# Patient Record
Sex: Female | Born: 1965 | Race: White | Hispanic: No | Marital: Married | Smoking: Never smoker
Health system: Southern US, Community
[De-identification: ages and names within clinical notes are randomized; demographics above are authoritative.]

## PROBLEM LIST (undated history)

## (undated) DIAGNOSIS — F419 Anxiety disorder, unspecified: Secondary | ICD-10-CM

## (undated) DIAGNOSIS — R51 Headache: Secondary | ICD-10-CM

## (undated) HISTORY — PX: WISDOM TOOTH EXTRACTION: SHX21

---

## 2003-11-01 HISTORY — PX: COLONOSCOPY: SHX174

## 2012-08-13 ENCOUNTER — Ambulatory Visit (INDEPENDENT_AMBULATORY_CARE_PROVIDER_SITE_OTHER): Payer: BC Managed Care – PPO | Admitting: Family Medicine

## 2012-08-13 ENCOUNTER — Encounter: Payer: Self-pay | Admitting: Family Medicine

## 2012-08-13 VITALS — BP 134/81 | HR 61 | Ht 67.0 in | Wt 145.0 lb

## 2012-08-13 DIAGNOSIS — M79671 Pain in right foot: Secondary | ICD-10-CM

## 2012-08-13 DIAGNOSIS — M2140 Flat foot [pes planus] (acquired), unspecified foot: Secondary | ICD-10-CM

## 2012-08-13 DIAGNOSIS — M79609 Pain in unspecified limb: Secondary | ICD-10-CM

## 2012-08-14 ENCOUNTER — Encounter: Payer: Self-pay | Admitting: Family Medicine

## 2012-08-14 DIAGNOSIS — M2140 Flat foot [pes planus] (acquired), unspecified foot: Secondary | ICD-10-CM | POA: Insufficient documentation

## 2012-08-14 DIAGNOSIS — M79672 Pain in left foot: Secondary | ICD-10-CM | POA: Insufficient documentation

## 2012-08-14 NOTE — Assessment & Plan Note (Signed)
no tenderness on exam dorsally, plantar areas.  Does sound more dynamic and related to pes planus.  This should respond to post tib rehab and inserts but she's not had success finding inserts that are comfortable to her - ones she brought with her have a very high arch and cause more arch pain.  Advised to try dr. Jari Sportsman active series over the counter.  Can consider our custom ones or sports insoles if she does not tolerate these.  Shown calf raise, theraband internal rotation exercises.  F/u in 1 month or as needed.

## 2012-08-14 NOTE — Progress Notes (Signed)
  Subjective:    Patient ID: Patty Hampton, female    DOB: [birth year], 46 y.o.   MRN: [id number]  PCP: Dr. Mikey Bussing  HPI 47 yo F here for bilateral foot pain.  Patient reports for several years she has had pain in L > R feet worse with sports (golf, tennis) and prolonged standing. Able to sit at work. No known injuries to feet. Pain does go up medial left leg at times. No numbness, tingling. Seen a PT who made her a couple different pairs of orthotics but she did not tolerate these. Pain is usually within arch but can be elsewhere on feet.  History reviewed. No pertinent past medical history.  Current Outpatient Prescriptions on File Prior to Visit  Medication Sig Dispense Refill  . NECON 1/50, 28, 1-50 MG-MCG tablet         History reviewed. No pertinent past surgical history.  No Known Allergies  History   Social History  . Marital Status: Married    Spouse Name: N/A    Number of Children: N/A  . Years of Education: N/A   Occupational History  . Not on file.   Social History Main Topics  . Smoking status: Never Smoker   . Smokeless tobacco: Not on file  . Alcohol Use: Not on file  . Drug Use: Not on file  . Sexually Active: Not on file   Other Topics Concern  . Not on file   Social History Narrative  . No narrative on file    Family History  Problem Relation Age of Onset  . Sudden death Neg Hx   . Hypertension Neg Hx   . Hyperlipidemia Neg Hx   . Heart attack Neg Hx   . Diabetes Neg Hx     BP 134/81  Pulse 61  Ht 5\' 7"  (1.702 m)  Wt 145 lb (65.772 kg)  BMI 22.71 kg/m2  Review of Systems See HPI above.    Objective:   Physical Exam Gen: NAD  Bilateral feet/ankles: No gross deformity, swelling, ecchymoses Mod overpronation slightly worse on left. FROM with 5/5 strength all directions grossly. No TTP throughout bilateral feet/ankles currently. Negative ant drawer and talar tilt.   Negative syndesmotic compression. Thompsons test  negative. NV intact distally.    Assessment & Plan:  1. Bilateral foot pain - no tenderness on exam dorsally, plantar areas.  Does sound more dynamic and related to pes planus.  This should respond to post tib rehab and inserts but she's not had success finding inserts that are comfortable to her - ones she brought with her have a very high arch and cause more arch pain.  Advised to try dr. Jari Sportsman active series over the counter.  Can consider our custom ones or sports insoles if she does not tolerate these.  Shown calf raise, theraband internal rotation exercises.  F/u in 1 month or as needed.

## 2012-12-04 ENCOUNTER — Encounter (HOSPITAL_COMMUNITY): Payer: Self-pay | Admitting: Pharmacist

## 2012-12-17 ENCOUNTER — Encounter (HOSPITAL_COMMUNITY)
Admission: RE | Admit: 2012-12-17 | Discharge: 2012-12-17 | Disposition: A | Discharge status: Home / Self Care | Payer: BC Managed Care – PPO | Source: Ambulatory Visit | Attending: Obstetrics and Gynecology | Admitting: Obstetrics and Gynecology

## 2012-12-17 ENCOUNTER — Encounter (HOSPITAL_COMMUNITY): Payer: Self-pay

## 2012-12-17 HISTORY — DX: Headache: R51

## 2012-12-17 HISTORY — DX: Anxiety disorder, unspecified: F41.9

## 2012-12-17 LAB — SURGICAL PCR SCREEN
MRSA, PCR: NEGATIVE
Staphylococcus aureus: NEGATIVE

## 2012-12-17 LAB — CBC
HCT: 43.2 % (ref 36.0–46.0)
Hemoglobin: 15.2 g/dL — ABNORMAL HIGH (ref 12.0–15.0)
RBC: 4.8 MIL/uL (ref 3.87–5.11)

## 2012-12-17 NOTE — H&P (Signed)
Patty Hampton is an 47 y.o. G 0 female presents for LAVH.  Had been of birth control pills for heavy cycles.  Despite this her cycles are heavy.  She had 3 to 4 days of heavy flow changing pads every hour.  Hemoglobin had been borderline at 11.8.  Saline infusion sonogram revealed multiple samll fibroids and probable adenomyosis.   Also having dyspareunia. Options discussed including mirena iud, depo provera, ablation or hysterectomy.  Admitted now for LAVH.    Pertinent Gynecological History: Menses: flow is excessive with use of 14 pads or tampons on heaviest days Bleeding: menorrhagia Contraception: OCP (estrogen/progesterone) DES exposure: denies Blood transfusions: none Sexually transmitted diseases: no past history Previous GYN Procedures: none  Last mammogram: normal Date: 10/2012 Last pap: normal Date: 102014 OB History: G0, P0   Menstrual History: Menarche age: 5  No LMP recorded.    No past medical history on file.  No past surgical history on file.  Family History  Problem Relation Age of Onset  . Sudden death Neg Hx   . Hypertension Neg Hx   . Hyperlipidemia Neg Hx   . Heart attack Neg Hx   . Diabetes Neg Hx     Social History:  reports that she has never smoked. She does not have any smokeless tobacco history on file. Her alcohol and drug histories are not on file.  Allergies:  Allergies  Allergen Reactions  . Tape Dermatitis    No prescriptions prior to admission    Review of Systems  All other systems reviewed and are negative.    There were no vitals taken for this visit. Physical Exam  Constitutional: She is oriented to person, place, and time. She appears well-developed and well-nourished.  HENT:  Head: Normocephalic.  Mouth/Throat: Oropharynx is clear and moist.  Eyes: Conjunctivae are normal. Pupils are equal, round, and reactive to light.  Cardiovascular: Normal rate, regular rhythm and normal heart sounds.   Respiratory: Effort normal and  breath sounds normal.  GI: Soft. Bowel sounds are normal.  Genitourinary:  Uterus upper limits of normal in size and tender. Adnexa clear.   Musculoskeletal: Normal range of motion.  Neurological: She is alert and oriented to person, place, and time. She has normal reflexes.    No results found for this or any previous visit (from the past 24 hour(s)).  No results found.  Assessment/Plan: Menorrhagia due to fibroids and adenomyosis Plan: precede with LAVH. Risk discussed.  Infection.  Hemorrhage that could require transfusions with the risk of AIDs and hepatitis.  Injury to adjacent organs such as bowel, bladder, and ureters that could require further surgery.  Risk of DVT and PE.  Patient expresses an understanding of risks and alternatives.  Harriet Sutphen S 12/17/2012, 9:07 AM

## 2012-12-17 NOTE — Patient Instructions (Addendum)
   Your procedure is scheduled on:Wednesday February 19th  Enter through the [employer] of [facility] at:7am Pick up the phone at the desk and dial [phone] and inform us of your arrival.  Please call this number if you have any problems the morning of surgery: [phone]  Remember: Do not eat or drink anything after midnight on Tuesday   Do not wear jewelry, make-up, or FINGER nail polish No metal in your hair or on your body. Do not wear lotions, powders, perfumes. You may wear deodorant.  Please use your CHG wash as directed prior to surgery.  Do not shave anywhere for at least 12 hours prior to first CHG shower.  Do not bring valuables to the hospital.   Leave suitcase in the car. After Surgery it may be brought to your room. For patients being admitted to the hospital, checkout time is 11:00am the day of discharge.

## 2012-12-18 ENCOUNTER — Encounter (HOSPITAL_COMMUNITY): Payer: Self-pay | Admitting: Anesthesiology

## 2012-12-18 NOTE — Anesthesia Preprocedure Evaluation (Addendum)
Anesthesia Evaluation  Patient identified by MRN, date of birth, ID band Patient awake    Reviewed: Allergy & Precautions, H&P , NPO status   Airway Mallampati: II TM Distance: >3 FB Neck ROM: Full    Dental no notable dental hx. (+) Teeth Intact   Pulmonary neg pulmonary ROS,  breath sounds clear to auscultation  Pulmonary exam normal       Cardiovascular negative cardio ROS  Rhythm:Regular Rate:Normal     Neuro/Psych  Headaches, Anxiety    GI/Hepatic negative GI ROS, Neg liver ROS,   Endo/Other  negative endocrine ROS  Renal/GU negative Renal ROS  negative genitourinary   Musculoskeletal negative musculoskeletal ROS (+)   Abdominal   Peds  Hematology negative hematology ROS (+)   Anesthesia Other Findings   Reproductive/Obstetrics Menorrhagia                          Anesthesia Physical Anesthesia Plan  ASA: II  Anesthesia Plan: General   Post-op Pain Management:    Induction: Intravenous  Airway Management Planned: Oral ETT  Additional Equipment:   Intra-op Plan:   Post-operative Plan: Extubation in OR  Informed Consent: I have reviewed the patients History and Physical, chart, labs and discussed the procedure including the risks, benefits and alternatives for the proposed anesthesia with the patient or authorized representative who has indicated his/her understanding and acceptance.   Dental advisory given  Plan Discussed with: CRNA, Anesthesiologist and Surgeon  Anesthesia Plan Comments:         Anesthesia Quick Evaluation

## 2012-12-19 ENCOUNTER — Encounter (HOSPITAL_COMMUNITY)
Admission: RE | Disposition: A | Discharge status: Home / Self Care | Payer: Self-pay | Source: Ambulatory Visit | Attending: Obstetrics and Gynecology

## 2012-12-19 ENCOUNTER — Encounter (HOSPITAL_COMMUNITY): Payer: Self-pay

## 2012-12-19 ENCOUNTER — Ambulatory Visit (HOSPITAL_COMMUNITY): Payer: BC Managed Care – PPO | Admitting: Anesthesiology

## 2012-12-19 ENCOUNTER — Encounter (HOSPITAL_COMMUNITY): Payer: Self-pay | Admitting: Anesthesiology

## 2012-12-19 ENCOUNTER — Observation Stay (HOSPITAL_COMMUNITY)
Admission: RE | Admit: 2012-12-19 | Discharge: 2012-12-20 | Disposition: A | Discharge status: Home / Self Care | Payer: BC Managed Care – PPO | Source: Ambulatory Visit | Attending: Obstetrics and Gynecology | Admitting: Obstetrics and Gynecology

## 2012-12-19 DIAGNOSIS — D251 Intramural leiomyoma of uterus: Secondary | ICD-10-CM | POA: Insufficient documentation

## 2012-12-19 DIAGNOSIS — N8000 Endometriosis of the uterus, unspecified: Secondary | ICD-10-CM | POA: Insufficient documentation

## 2012-12-19 DIAGNOSIS — D259 Leiomyoma of uterus, unspecified: Secondary | ICD-10-CM | POA: Diagnosis present

## 2012-12-19 DIAGNOSIS — N72 Inflammatory disease of cervix uteri: Secondary | ICD-10-CM | POA: Insufficient documentation

## 2012-12-19 DIAGNOSIS — D250 Submucous leiomyoma of uterus: Secondary | ICD-10-CM | POA: Insufficient documentation

## 2012-12-19 DIAGNOSIS — N946 Dysmenorrhea, unspecified: Secondary | ICD-10-CM | POA: Diagnosis present

## 2012-12-19 DIAGNOSIS — M2140 Flat foot [pes planus] (acquired), unspecified foot: Secondary | ICD-10-CM

## 2012-12-19 DIAGNOSIS — N920 Excessive and frequent menstruation with regular cycle: Principal | ICD-10-CM | POA: Insufficient documentation

## 2012-12-19 DIAGNOSIS — IMO0002 Reserved for concepts with insufficient information to code with codable children: Secondary | ICD-10-CM | POA: Insufficient documentation

## 2012-12-19 DIAGNOSIS — M79671 Pain in right foot: Secondary | ICD-10-CM

## 2012-12-19 DIAGNOSIS — Z90710 Acquired absence of both cervix and uterus: Secondary | ICD-10-CM

## 2012-12-19 DIAGNOSIS — D252 Subserosal leiomyoma of uterus: Secondary | ICD-10-CM | POA: Insufficient documentation

## 2012-12-19 HISTORY — PX: LAPAROSCOPIC ASSISTED VAGINAL HYSTERECTOMY: SHX5398

## 2012-12-19 LAB — HCG, SERUM, QUALITATIVE: Preg, Serum: NEGATIVE

## 2012-12-19 SURGERY — HYSTERECTOMY, VAGINAL, LAPAROSCOPY-ASSISTED
Anesthesia: General | Site: Abdomen | Wound class: Clean Contaminated

## 2012-12-19 MED ORDER — ONDANSETRON HCL 4 MG PO TABS: 4.0000 mg | ORAL_TABLET | Freq: Four times a day (QID) | ORAL | Status: DC | PRN

## 2012-12-19 MED ORDER — ONDANSETRON HCL 4 MG/2ML IJ SOLN
INTRAMUSCULAR | Status: DC | PRN
  Administered 2012-12-19: 4 mg via INTRAVENOUS

## 2012-12-19 MED ORDER — NEOSTIGMINE METHYLSULFATE 1 MG/ML IJ SOLN
INTRAMUSCULAR | Status: AC
  Filled 2012-12-19: qty 1

## 2012-12-19 MED ORDER — BUPIVACAINE HCL (PF) 0.25 % IJ SOLN
INTRAMUSCULAR | Status: AC
  Filled 2012-12-19: qty 30

## 2012-12-19 MED ORDER — GLYCOPYRROLATE 0.2 MG/ML IJ SOLN
INTRAMUSCULAR | Status: AC
  Filled 2012-12-19: qty 2

## 2012-12-19 MED ORDER — HYDROMORPHONE HCL PF 1 MG/ML IJ SOLN
INTRAMUSCULAR | Status: AC
  Administered 2012-12-19: 0.5 mg via INTRAVENOUS
  Filled 2012-12-19: qty 1

## 2012-12-19 MED ORDER — DIPHENHYDRAMINE HCL 50 MG/ML IJ SOLN: 12.5000 mg | Freq: Four times a day (QID) | INTRAMUSCULAR | Status: DC | PRN

## 2012-12-19 MED ORDER — LIDOCAINE HCL (CARDIAC) 20 MG/ML IV SOLN
INTRAVENOUS | Status: DC | PRN
  Administered 2012-12-19: 60 mg via INTRAVENOUS

## 2012-12-19 MED ORDER — NALOXONE HCL 0.4 MG/ML IJ SOLN: 0.4000 mg | INTRAMUSCULAR | Status: DC | PRN

## 2012-12-19 MED ORDER — METOCLOPRAMIDE HCL 5 MG/ML IJ SOLN
INTRAMUSCULAR | Status: AC
  Administered 2012-12-19: 10 mg via INTRAVENOUS
  Filled 2012-12-19: qty 2

## 2012-12-19 MED ORDER — FENTANYL CITRATE 0.05 MG/ML IJ SOLN
INTRAMUSCULAR | Status: DC | PRN
  Administered 2012-12-19 (×7): 50 ug via INTRAVENOUS

## 2012-12-19 MED ORDER — GLYCOPYRROLATE 0.2 MG/ML IJ SOLN
INTRAMUSCULAR | Status: DC | PRN
  Administered 2012-12-19: 0.1 mg via INTRAVENOUS

## 2012-12-19 MED ORDER — CEFAZOLIN SODIUM-DEXTROSE 2-3 GM-% IV SOLR
2.0000 g | INTRAVENOUS | Status: AC
  Administered 2012-12-19: 2 g via INTRAVENOUS

## 2012-12-19 MED ORDER — LACTATED RINGERS IR SOLN
Status: DC | PRN
  Administered 2012-12-19: 3000 mL

## 2012-12-19 MED ORDER — PROPOFOL 10 MG/ML IV EMUL
INTRAVENOUS | Status: DC | PRN
  Administered 2012-12-19: 150 mg via INTRAVENOUS

## 2012-12-19 MED ORDER — SCOPOLAMINE 1 MG/3DAYS TD PT72
1.0000 | MEDICATED_PATCH | TRANSDERMAL | Status: DC
  Administered 2012-12-19: 1.5 mg via TRANSDERMAL

## 2012-12-19 MED ORDER — METOCLOPRAMIDE HCL 5 MG/ML IJ SOLN
10.0000 mg | Freq: Once | INTRAMUSCULAR | Status: AC | PRN
  Administered 2012-12-19: 10 mg via INTRAVENOUS

## 2012-12-19 MED ORDER — FENTANYL CITRATE 0.05 MG/ML IJ SOLN
INTRAMUSCULAR | Status: AC
  Filled 2012-12-19: qty 2

## 2012-12-19 MED ORDER — ACETAMINOPHEN 325 MG PO TABS: 650.0000 mg | ORAL_TABLET | ORAL | Status: DC | PRN

## 2012-12-19 MED ORDER — KETOROLAC TROMETHAMINE 30 MG/ML IJ SOLN
INTRAMUSCULAR | Status: DC | PRN
  Administered 2012-12-19: 60 mg via INTRAVENOUS

## 2012-12-19 MED ORDER — HYDROMORPHONE 0.3 MG/ML IV SOLN
INTRAVENOUS | Status: DC
  Administered 2012-12-19: 0.999 mg via INTRAVENOUS
  Administered 2012-12-19: 0.2 mg via INTRAVENOUS
  Administered 2012-12-19: 12:00:00 via INTRAVENOUS
  Administered 2012-12-20: 0.2 mg via INTRAVENOUS
  Administered 2012-12-20: 0.199 mg via INTRAVENOUS
  Filled 2012-12-19: qty 25

## 2012-12-19 MED ORDER — ROCURONIUM BROMIDE 100 MG/10ML IV SOLN
INTRAVENOUS | Status: DC | PRN
  Administered 2012-12-19: 5 mg via INTRAVENOUS
  Administered 2012-12-19: 35 mg via INTRAVENOUS

## 2012-12-19 MED ORDER — MEPERIDINE HCL 25 MG/ML IJ SOLN: 6.2500 mg | INTRAMUSCULAR | Status: DC | PRN

## 2012-12-19 MED ORDER — KETOROLAC TROMETHAMINE 30 MG/ML IJ SOLN
INTRAMUSCULAR | Status: AC
  Filled 2012-12-19: qty 2

## 2012-12-19 MED ORDER — ONDANSETRON HCL 4 MG/2ML IJ SOLN
INTRAMUSCULAR | Status: AC
  Filled 2012-12-19: qty 2

## 2012-12-19 MED ORDER — MENTHOL 3 MG MT LOZG
1.0000 | LOZENGE | OROMUCOSAL | Status: DC | PRN
  Administered 2012-12-20: 3 mg via ORAL
  Filled 2012-12-19: qty 9

## 2012-12-19 MED ORDER — MIDAZOLAM HCL 5 MG/5ML IJ SOLN
INTRAMUSCULAR | Status: DC | PRN
  Administered 2012-12-19: 2 mg via INTRAVENOUS

## 2012-12-19 MED ORDER — CEFAZOLIN SODIUM-DEXTROSE 2-3 GM-% IV SOLR
INTRAVENOUS | Status: AC
  Filled 2012-12-19: qty 50

## 2012-12-19 MED ORDER — ONDANSETRON HCL 4 MG/2ML IJ SOLN
4.0000 mg | Freq: Four times a day (QID) | INTRAMUSCULAR | Status: DC | PRN
  Administered 2012-12-19: 4 mg via INTRAVENOUS

## 2012-12-19 MED ORDER — ZOLPIDEM TARTRATE 5 MG PO TABS: 5.0000 mg | ORAL_TABLET | Freq: Every evening | ORAL | Status: DC | PRN

## 2012-12-19 MED ORDER — FENTANYL CITRATE 0.05 MG/ML IJ SOLN
INTRAMUSCULAR | Status: AC
  Filled 2012-12-19: qty 5

## 2012-12-19 MED ORDER — ROCURONIUM BROMIDE 50 MG/5ML IV SOLN
INTRAVENOUS | Status: AC
  Filled 2012-12-19: qty 1

## 2012-12-19 MED ORDER — SODIUM CHLORIDE 0.9 % IJ SOLN: 9.0000 mL | INTRAMUSCULAR | Status: DC | PRN

## 2012-12-19 MED ORDER — PROPOFOL 10 MG/ML IV EMUL
INTRAVENOUS | Status: AC
  Filled 2012-12-19: qty 20

## 2012-12-19 MED ORDER — DEXAMETHASONE SODIUM PHOSPHATE 10 MG/ML IJ SOLN
INTRAMUSCULAR | Status: AC
  Filled 2012-12-19: qty 1

## 2012-12-19 MED ORDER — HYDROMORPHONE HCL PF 1 MG/ML IJ SOLN
0.2500 mg | INTRAMUSCULAR | Status: DC | PRN
  Administered 2012-12-19 (×4): 0.5 mg via INTRAVENOUS

## 2012-12-19 MED ORDER — DEXAMETHASONE SODIUM PHOSPHATE 4 MG/ML IJ SOLN
INTRAMUSCULAR | Status: DC | PRN
  Administered 2012-12-19: 10 mg via INTRAVENOUS

## 2012-12-19 MED ORDER — ONDANSETRON HCL 4 MG/2ML IJ SOLN
4.0000 mg | Freq: Four times a day (QID) | INTRAMUSCULAR | Status: DC | PRN
  Filled 2012-12-19: qty 2

## 2012-12-19 MED ORDER — BUPIVACAINE HCL (PF) 0.25 % IJ SOLN
INTRAMUSCULAR | Status: DC | PRN
  Administered 2012-12-19: 5 mL

## 2012-12-19 MED ORDER — MIDAZOLAM HCL 2 MG/2ML IJ SOLN
INTRAMUSCULAR | Status: AC
  Filled 2012-12-19: qty 2

## 2012-12-19 MED ORDER — LIDOCAINE HCL (CARDIAC) 20 MG/ML IV SOLN
INTRAVENOUS | Status: AC
Start: 2012-12-19 — End: 2012-12-19
  Filled 2012-12-19: qty 5

## 2012-12-19 MED ORDER — SCOPOLAMINE 1 MG/3DAYS TD PT72
MEDICATED_PATCH | TRANSDERMAL | Status: AC
  Administered 2012-12-19: 1.5 mg via TRANSDERMAL
  Filled 2012-12-19: qty 1

## 2012-12-19 MED ORDER — LACTATED RINGERS IV SOLN
INTRAVENOUS | Status: DC
  Administered 2012-12-19 – 2012-12-20 (×2): via INTRAVENOUS

## 2012-12-19 MED ORDER — ACETAMINOPHEN 10 MG/ML IV SOLN
1000.0000 mg | Freq: Four times a day (QID) | INTRAVENOUS | Status: DC
  Administered 2012-12-19: 1000 mg via INTRAVENOUS
  Filled 2012-12-19 (×3): qty 100

## 2012-12-19 MED ORDER — EPHEDRINE SULFATE 50 MG/ML IJ SOLN
INTRAMUSCULAR | Status: DC | PRN
  Administered 2012-12-19 (×2): 5 mg via INTRAVENOUS

## 2012-12-19 MED ORDER — OXYCODONE-ACETAMINOPHEN 5-325 MG PO TABS
1.0000 | ORAL_TABLET | ORAL | Status: DC | PRN
  Administered 2012-12-20: 1 via ORAL
  Filled 2012-12-19: qty 1

## 2012-12-19 MED ORDER — DIPHENHYDRAMINE HCL 12.5 MG/5ML PO ELIX: 12.5000 mg | ORAL_SOLUTION | Freq: Four times a day (QID) | ORAL | Status: DC | PRN

## 2012-12-19 MED ORDER — LACTATED RINGERS IV SOLN
INTRAVENOUS | Status: DC
  Administered 2012-12-19 (×5): via INTRAVENOUS

## 2012-12-19 SURGICAL SUPPLY — 37 items
CABLE HIGH FREQUENCY MONO STRZ (ELECTRODE) IMPLANT
CATH ROBINSON RED A/P 16FR (CATHETERS) ×2 IMPLANT
CLOTH BEACON ORANGE TIMEOUT ST (SAFETY) ×2 IMPLANT
CONT PATH 16OZ SNAP LID 3702 (MISCELLANEOUS) ×2 IMPLANT
COVER TABLE BACK 60X90 (DRAPES) ×2 IMPLANT
DECANTER SPIKE VIAL GLASS SM (MISCELLANEOUS) IMPLANT
DERMABOND ADVANCED (GAUZE/BANDAGES/DRESSINGS) ×1
DERMABOND ADVANCED .7 DNX12 (GAUZE/BANDAGES/DRESSINGS) ×1 IMPLANT
ELECT REM PT RETURN 9FT ADLT (ELECTROSURGICAL) ×2
ELECTRODE REM PT RTRN 9FT ADLT (ELECTROSURGICAL) ×1 IMPLANT
GLOVE BIO SURGEON STRL SZ7 (GLOVE) ×6 IMPLANT
GLOVE BIOGEL PI IND STRL 6.5 (GLOVE) ×1 IMPLANT
GLOVE BIOGEL PI INDICATOR 6.5 (GLOVE) ×1
GOWN STRL REIN XL XLG (GOWN DISPOSABLE) ×8 IMPLANT
NEEDLE INSUFFLATION 120MM (ENDOMECHANICALS) IMPLANT
NS IRRIG 1000ML POUR BTL (IV SOLUTION) ×2 IMPLANT
PACK LAVH (CUSTOM PROCEDURE TRAY) ×2 IMPLANT
PROTECTOR NERVE ULNAR (MISCELLANEOUS) ×2 IMPLANT
SEALER TISSUE G2 CVD JAW 45CM (ENDOMECHANICALS) ×2 IMPLANT
SET IRRIG TUBING LAPAROSCOPIC (IRRIGATION / IRRIGATOR) ×2 IMPLANT
STRIP CLOSURE SKIN 1/4X3 (GAUZE/BANDAGES/DRESSINGS) IMPLANT
SUT MON AB 2-0 CT1 27 (SUTURE) ×4 IMPLANT
SUT VIC AB 0 CT1 18XCR BRD8 (SUTURE) ×3 IMPLANT
SUT VIC AB 0 CT1 27 (SUTURE) ×1
SUT VIC AB 0 CT1 27XBRD ANBCTR (SUTURE) ×1 IMPLANT
SUT VIC AB 0 CT1 36 (SUTURE) ×2 IMPLANT
SUT VIC AB 0 CT1 8-18 (SUTURE) ×3
SUT VICRYL 0 UR6 27IN ABS (SUTURE) IMPLANT
SUT VICRYL 1 TIES 12X18 (SUTURE) ×2 IMPLANT
SUT VICRYL 4-0 PS2 18IN ABS (SUTURE) ×2 IMPLANT
TOWEL OR 17X24 6PK STRL BLUE (TOWEL DISPOSABLE) ×4 IMPLANT
TRAY FOLEY CATH 14FR (SET/KITS/TRAYS/PACK) ×2 IMPLANT
TROCAR BALLN 12MMX100 BLUNT (TROCAR) IMPLANT
TROCAR OPTI TIP 5M 100M (ENDOMECHANICALS) ×2 IMPLANT
TROCAR XCEL DIL TIP R 11M (ENDOMECHANICALS) IMPLANT
WARMER LAPAROSCOPE (MISCELLANEOUS) ×2 IMPLANT
WATER STERILE IRR 1000ML POUR (IV SOLUTION) ×2 IMPLANT

## 2012-12-19 NOTE — H&P (Signed)
  History and physical exam unchanged 

## 2012-12-19 NOTE — Transfer of Care (Signed)
Immediate Anesthesia Transfer of Care Note  Patient: Patty Hampton  Procedure(s) Performed: Procedure(s): LAPAROSCOPIC ASSISTED VAGINAL HYSTERECTOMY (N/A)  Patient Location: PACU  Anesthesia Type:General  Level of Consciousness: awake, alert , oriented and patient cooperative  Airway & Oxygen Therapy: Patient Spontanous Breathing and Patient connected to nasal cannula oxygen  Post-op Assessment: Report given to PACU RN and Post -op Vital signs reviewed and stable  Post vital signs: Reviewed and stable  Complications: No apparent anesthesia complications

## 2012-12-19 NOTE — Anesthesia Postprocedure Evaluation (Signed)
  Anesthesia Post-op Note  Patient: Patty Hampton  Procedure(s) Performed: Procedure(s): LAPAROSCOPIC ASSISTED VAGINAL HYSTERECTOMY (N/A)  Patient Location: [facility]  Anesthesia Type:General  Level of Consciousness: awake, alert  and oriented  Airway and Oxygen Therapy: Patient Spontanous Breathing and Patient connected to nasal cannula oxygen  Post-op Pain: mild  Post-op Assessment: Post-op Vital signs reviewed and Patient's Cardiovascular Status Stable  Post-op Vital Signs: Reviewed and stable  Complications: No apparent anesthesia complications

## 2012-12-19 NOTE — Op Note (Signed)
Patient name  Patty Hampton, Patty Hampton DICTATION#  [id number] CSN# [id number]  [facility], MD 12/19/2012 10:06 AM

## 2012-12-19 NOTE — Op Note (Signed)
NAMEDARIS, ARISTIZABAL NO.:  [id number]  MEDICAL RECORD NO.:  [id number]  LOCATION:  9319                          FACILITY:  [facility]  PHYSICIAN:  Juluis Mire, M.D.   DATE OF BIRTH:  01-18-1966  DATE OF PROCEDURE:  12/19/2012 DATE OF DISCHARGE:                              OPERATIVE REPORT   PREOPERATIVE DIAGNOSIS:  Menorrhagia secondary to adenomyosis and uterine fibroids.  POSTOPERATIVE DIAGNOSIS:  Menorrhagia secondary to adenomyosis and uterine fibroids.  PROCEDURE:  Laparoscopic-assisted vaginal hysterectomy.  SURGEON:  Juluis Mire, MD  ASSISTANT:  Marcelle Overlie.  BLOOD LOSS:  200-300 mL.  PACKS:  None.  DRAINS:  Urethral Foley.  INTRAOPERATIVE BLOOD REPLACEMENT:  None.  COMPLICATIONS:  None.  INDICATIONS:  Are as dictated in history and physical.  Procedure as follows, the patient was taken to the OR and placed in supine position. After satisfactory level of general endotracheal anesthesia obtained, the patient was placed in dorsal lithotomy position using the Allen stirrups.  The abdomen, perineum, and vagina were prepped out with Betadine.  Bladder was emptied by in and out catheterization.  A Hulka tenaculum was put on the cervix and secured.  The patient was draped sterile field.  Subumbilical incision was made with a knife.  Veress needle was introduced into the abdominal cavity.  Abdomen inflated with approximately 3 liters of carbon dioxide.  A 10/11 trocar was then inserted.  Laparoscope was introduced.  There was no evidence of injury to adjacent organs.  A 5-mm trocar was put in place in suprapubic area. Visualization revealed a normal appendix.  Upper abdomen including liver tip of the gallbladder were clear.  Both lateral gutters were clear. Uterus was enlarged with multiple fibroids.  Tubes and ovaries were unremarkable.  Cul-de-sac was clear.  I used an EnSeal, first the right utero-ovarian ligament was cauterized and incised.  The  right tube and mesosalpinx were cauterized, incised, and the right round ligament was cauterized and incised.  We then went to the left side where the left utero-ovarian pedicle was cauterized and incised.  Left tube and mesosalpinx were cauterized and incised and the left round ligament was cauterized and incised.  We had good freeing up of the adnexa.  At this point in time, decision was to go vaginally.  Laparoscope was removed. The abdomen was deflated with carbon dioxide.  Legs were repositioned. Hulka sac was then removed.  A weighted speculum was placed in vaginal vault.  Cul-de-sac was entered sharply.  Both uterosacral ligaments were clamped, cut, and suture ligated with 0 Vicryl.  The reflection of vaginal mucosa anteriorly was incised.  Bladder was dissected superiorly.  Paracervical tissue __________ suture ligated with 0 Vicryl.  Vesicouterine space was eventually identified and entered sharply.  Using the clamp, cut, and tie technique with suture ligature of 0 Vicryl, the parametrium serially separated from sides of uterus. Because of the size of the uterus, we decided to morcellate. We first cut off the cervix and lower uterine segment.  We continued the morcellation process, removed several fibroids.  Eventually, we delivered the uterine fundus.  Remaining pedicles were clamped, cut, and the remainder of the uterine  fundus was passed off the operative field. Held pedicles were secured with free tie of 0 Vicryl.  Posterior vaginal cuff was closed in running interlocking suture of 0 Vicryl.  Uterosacral plication stitch of 0 Vicryl,  placed and secured.  The vaginal mucosa was reapproximated with interrupted figure-of-eights of 2-0 Monocryl.  A Foley was placed to straight drain with clear urine noted.  Laparoscope was reintroduced.  Abdomen was reinflated with carbon dioxide.  Some oozing was noted coming from the right ovary.  This was brought under control with EnSeal,  had some oozing from the cath, also brought and covered the EnSeal, otherwise everything was hemostatic, we thoroughly irrigated the pelvis.  Irrigation was removed and no active bleeding was encountered.  The abdomen was deflated and reinflated and no bleeding was encountered.  At this point in time, the abdomen was deflated with carbon dioxide.  All trocars removed.  Subumbilical incision was closed with interrupted subcuticulars of 4-0 Vicryl. Suprapubic incision was closed with Dermabond.  The patient taken out of dorsal lithotomy position.  Once alert, extubated, and transferred to recovery room in good condition.  Sponge, instrument, and needle count was correct by circulating nurse x2.  Foley catheter remained clear at the time of closure.     Juluis Mire, M.D.     JSM/MEDQ  D:  12/19/2012  T:  12/19/2012  Job:  [id number]

## 2012-12-19 NOTE — Anesthesia Postprocedure Evaluation (Signed)
  Anesthesia Post Note  Patient: Patty Hampton  Procedure(s) Performed: Procedure(s) (LRB): LAPAROSCOPIC ASSISTED VAGINAL HYSTERECTOMY (N/A)  Anesthesia type: GA  Patient location: PACU  Post pain: Pain level controlled  Post assessment: Post-op Vital signs reviewed  Last Vitals:  Filed Vitals:   12/19/12 1045  BP: 107/54  Pulse: 59  Temp:   Resp: 16    Post vital signs: Reviewed  Level of consciousness: sedated  Complications: No apparent anesthesia complications

## 2012-12-19 NOTE — Brief Op Note (Signed)
12/19/2012  10:04 AM  PATIENT:  Patty Hampton  47 y.o. female  PRE-OPERATIVE DIAGNOSIS:  MENORRHAGIA  CPT [phone]  POST-OPERATIVE DIAGNOSIS:  menorrhagia  PROCEDURE:  Procedure(s): LAPAROSCOPIC ASSISTED VAGINAL HYSTERECTOMY (N/A)  SURGEON:  Surgeon(s) and Role:    * Juluis Mire, MD - Primary    * Meriel Pica, MD - Assisting  PHYSICIAN ASSISTANT:   ASSISTANTS: holland   ANESTHESIA:   general  EBL:  Total I/O In: 1800 [I.V.:1800] Out: 300 [Urine:100; Blood:200]  BLOOD ADMINISTERED:none  DRAINS: Urinary Catheter (Foley)   LOCAL MEDICATIONS USED:  MARCAINE     SPECIMEN:  Source of Specimen:  uterus  DISPOSITION OF SPECIMEN:  PATHOLOGY  COUNTS:  YES  TOURNIQUET:  * No tourniquets in log *  DICTATION: .Other Dictation: Dictation Number [id number]  PLAN OF CARE: Admit for overnight observation  PATIENT DISPOSITION:  PACU - hemodynamically stable.   Delay start of Pharmacological VTE agent (>24hrs) due to surgical blood loss or risk of bleeding: yes

## 2012-12-19 NOTE — Progress Notes (Signed)
Patient ID: Patty Hampton, female   DOB: 1966-08-31, 47 y.o.   MRN: [id number] Doing well Af vss  abd soft  Inc cleatr Good uo

## 2012-12-19 NOTE — Preoperative (Addendum)
Beta Blockers   Reason not to administer Beta Blockers:Not Applicable 

## 2012-12-20 ENCOUNTER — Encounter (HOSPITAL_COMMUNITY): Payer: Self-pay | Admitting: Obstetrics and Gynecology

## 2012-12-20 LAB — CBC
HCT: 32.9 % — ABNORMAL LOW (ref 36.0–46.0)
Hemoglobin: 11.3 g/dL — ABNORMAL LOW (ref 12.0–15.0)
MCHC: 34.3 g/dL (ref 30.0–36.0)
MCV: 88.9 fL (ref 78.0–100.0)
RDW: 11.7 % (ref 11.5–15.5)

## 2012-12-20 NOTE — Discharge Summary (Signed)
  Patient name  Patty Hampton, [name] DICTATION# [id number] CSN# [id number]  Juluis Mire, MD 12/20/2012 8:53 AM

## 2012-12-20 NOTE — Progress Notes (Signed)
1 Day Post-Op Procedure(s) (LRB): LAPAROSCOPIC ASSISTED VAGINAL HYSTERECTOMY (N/A)  Subjective: Patient reports tolerating PO and no problems voiding.    Objective: I have reviewed patient's vital signs and labs.  GI: soft, non-tender; bowel sounds normal; no masses,  no organomegaly and incision: clean Vaginal Bleeding: minimal  Assessment: s/p Procedure(s): LAPAROSCOPIC ASSISTED VAGINAL HYSTERECTOMY (N/A): stable  Plan: Discharge home  LOS: 1 day    Yarah Fuente S 12/20/2012, 8:49 AM

## 2012-12-20 NOTE — Discharge Summary (Signed)
NAMESYLVANA, BONK NO.:  [id number]  MEDICAL RECORD NO.:  [id number]  LOCATION:  9319                          FACILITY:  [facility]  PHYSICIAN:  Juluis Mire, M.D.   DATE OF BIRTH:  08-22-1966  DATE OF ADMISSION:  12/19/2012 DATE OF DISCHARGE:  12/20/2012                              DISCHARGE SUMMARY   DISCHARGE DIAGNOSIS:  Menorrhagia secondary to uterine fibroids.  DISCHARGE DIAGNOSIS:  Menorrhagia secondary to uterine fibroids.  PROCEDURES:  Laparoscopic-assisted vaginal hysterectomy.  HISTORY OF PRESENT ILLNESS:  For complete history and physical please see dictated note.  COURSE IN THE HOSPITAL:  The patient underwent above-noted surgery. Postoperatively did well.  Her postop hemoglobin was 11.3.  She was discharged home on her morning of 1st postop day.  At that time, afebrile and stable vital signs.  Abdomen is soft.  Bowel sounds were active.  All incisions were clear.  Minimal vaginal bleeding.  Was voiding without difficulty.  In terms of complication, none encountered on her stay in the hospital. The patient is discharged home in stable condition.  DISPOSITION:  The patient to avoid heavy lifting, vaginal entrance, or driving a car.  She was instructed to call should there be signs of heavy bleeding.  Call with temperatures persistently above 100.  Call with nausea and vomiting.  Call with excessive pain.  Also instructed on signs and symptoms of deep venous thrombosis and pulmonary embolus. Discharged home on Percocet as needed for pain as well as stool softener.  Followup in the office in 1 week.     Juluis Mire, M.D.     JSM/MEDQ  D:  12/20/2012  T:  12/20/2012  Job:  [id number]

## 2013-12-10 ENCOUNTER — Other Ambulatory Visit: Payer: Self-pay | Admitting: Obstetrics and Gynecology

## 2013-12-10 DIAGNOSIS — R928 Other abnormal and inconclusive findings on diagnostic imaging of breast: Secondary | ICD-10-CM

## 2013-12-20 ENCOUNTER — Ambulatory Visit
Admission: RE | Admit: 2013-12-20 | Discharge: 2013-12-20 | Disposition: A | Discharge status: Home / Self Care | Payer: BC Managed Care – PPO | Source: Ambulatory Visit | Attending: Obstetrics and Gynecology | Admitting: Obstetrics and Gynecology

## 2013-12-20 DIAGNOSIS — R928 Other abnormal and inconclusive findings on diagnostic imaging of breast: Secondary | ICD-10-CM

## 2014-12-08 ENCOUNTER — Other Ambulatory Visit: Payer: Self-pay | Admitting: Obstetrics and Gynecology

## 2014-12-09 LAB — CYTOLOGY - PAP

## 2016-06-22 ENCOUNTER — Other Ambulatory Visit: Payer: Self-pay | Admitting: Obstetrics and Gynecology

## 2016-06-22 DIAGNOSIS — N63 Unspecified lump in unspecified breast: Secondary | ICD-10-CM

## 2016-06-27 ENCOUNTER — Ambulatory Visit
Admission: RE | Admit: 2016-06-27 | Discharge: 2016-06-27 | Disposition: A | Discharge status: Home / Self Care | Payer: Self-pay | Source: Ambulatory Visit | Attending: Obstetrics and Gynecology | Admitting: Obstetrics and Gynecology

## 2016-06-27 ENCOUNTER — Ambulatory Visit
Admission: RE | Admit: 2016-06-27 | Discharge: 2016-06-27 | Disposition: A | Discharge status: Home / Self Care | Payer: 59 | Source: Ambulatory Visit | Attending: Obstetrics and Gynecology | Admitting: Obstetrics and Gynecology

## 2016-06-27 DIAGNOSIS — N63 Unspecified lump in unspecified breast: Secondary | ICD-10-CM

## 2021-06-15 ENCOUNTER — Other Ambulatory Visit: Payer: Self-pay | Admitting: Physician Assistant

## 2021-06-15 DIAGNOSIS — Z800 Family history of malignant neoplasm of digestive organs: Secondary | ICD-10-CM

## 2021-07-09 ENCOUNTER — Other Ambulatory Visit: Payer: 59

## 2021-07-12 ENCOUNTER — Other Ambulatory Visit: Payer: 59

## 2021-07-20 ENCOUNTER — Ambulatory Visit
Admission: RE | Admit: 2021-07-20 | Discharge: 2021-07-20 | Disposition: A | Discharge status: Home / Self Care | Payer: 59 | Source: Ambulatory Visit | Attending: Physician Assistant | Admitting: Physician Assistant

## 2021-07-20 ENCOUNTER — Other Ambulatory Visit: Payer: Self-pay

## 2021-07-20 DIAGNOSIS — Z800 Family history of malignant neoplasm of digestive organs: Secondary | ICD-10-CM

## 2021-07-20 MED ORDER — GADOBENATE DIMEGLUMINE 529 MG/ML IV SOLN
15.0000 mL | Freq: Once | INTRAVENOUS | Status: AC | PRN
  Administered 2021-07-20: 15 mL via INTRAVENOUS

## 2021-11-10 ENCOUNTER — Other Ambulatory Visit: Payer: Self-pay | Admitting: Obstetrics and Gynecology

## 2021-11-10 DIAGNOSIS — E785 Hyperlipidemia, unspecified: Secondary | ICD-10-CM

## 2021-11-29 ENCOUNTER — Ambulatory Visit
Admission: RE | Admit: 2021-11-29 | Discharge: 2021-11-29 | Disposition: A | Discharge status: Home / Self Care | Payer: No Typology Code available for payment source | Source: Ambulatory Visit | Attending: Obstetrics and Gynecology | Admitting: Obstetrics and Gynecology

## 2021-11-29 DIAGNOSIS — E785 Hyperlipidemia, unspecified: Secondary | ICD-10-CM

## 2022-05-26 ENCOUNTER — Other Ambulatory Visit: Payer: Self-pay | Admitting: Obstetrics and Gynecology

## 2022-05-26 DIAGNOSIS — R928 Other abnormal and inconclusive findings on diagnostic imaging of breast: Secondary | ICD-10-CM

## 2022-05-31 ENCOUNTER — Ambulatory Visit
Admission: RE | Admit: 2022-05-31 | Discharge: 2022-05-31 | Disposition: A | Discharge status: Home / Self Care | Payer: No Typology Code available for payment source | Source: Ambulatory Visit | Attending: Obstetrics and Gynecology | Admitting: Obstetrics and Gynecology

## 2022-05-31 DIAGNOSIS — R928 Other abnormal and inconclusive findings on diagnostic imaging of breast: Secondary | ICD-10-CM

## 2022-06-03 ENCOUNTER — Other Ambulatory Visit: Payer: No Typology Code available for payment source

## 2023-04-05 IMAGING — CT CT CARDIAC CORONARY ARTERY CALCIUM SCORE
3 series · 12 of 20 positions shown, 14 images · non-contrast
Comparison: None

CLINICAL DATA: A 55-year-old white female with hyperlipidemia
presents for evaluation of coronary artery calcium.

EXAM:
CT CARDIAC CORONARY ARTERY CALCIUM SCORE
TECHNIQUE: Non-contrast imaging through the heart was performed using
prospective ECG gating. Image post processing was performed on an
independent workstation, allowing for quantitative analysis of the
heart and coronary arteries. Note that this exam targets the heart
and the chest was not imaged in its entirety.

[Series 2: calcium scoring 2.00 qr36 bestdiast 69% hrt calciu · axial · 0.38mm/px · z∈[+1564,+1596]mm · 2 of 80 slices shown]
[im 16/80  vessel]
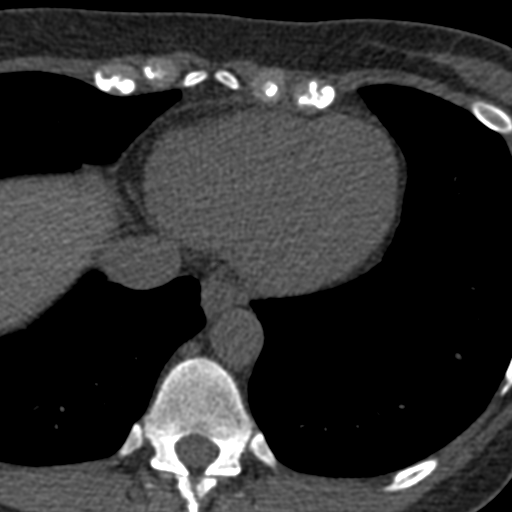
[im 32/80  vessel]
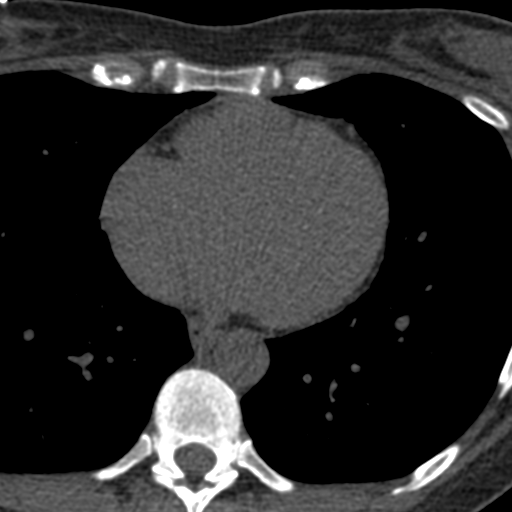

[Series 3: calcium scoring 2.00 br40 bestdiast 69% axial · axial · 0.49mm/px · z∈[+1560,+1664]mm · 5 of 80 slices shown, 7 images]
[im 14/80  vessel]
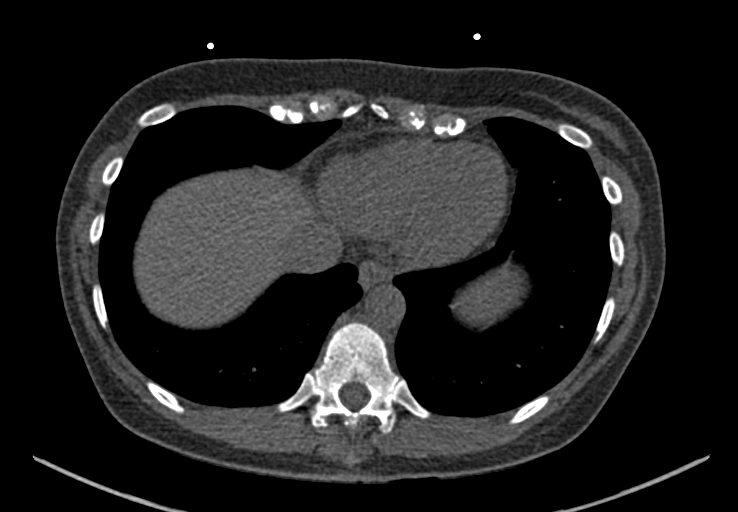
[im 14/80  lung]
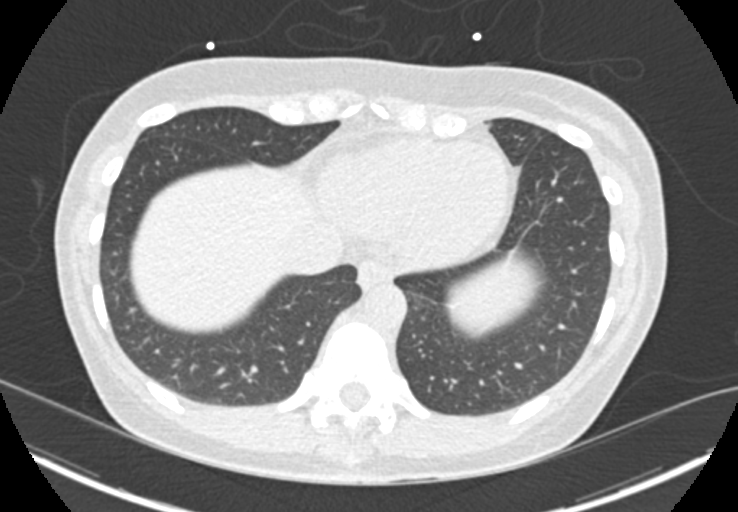
[im 27/80  vessel]
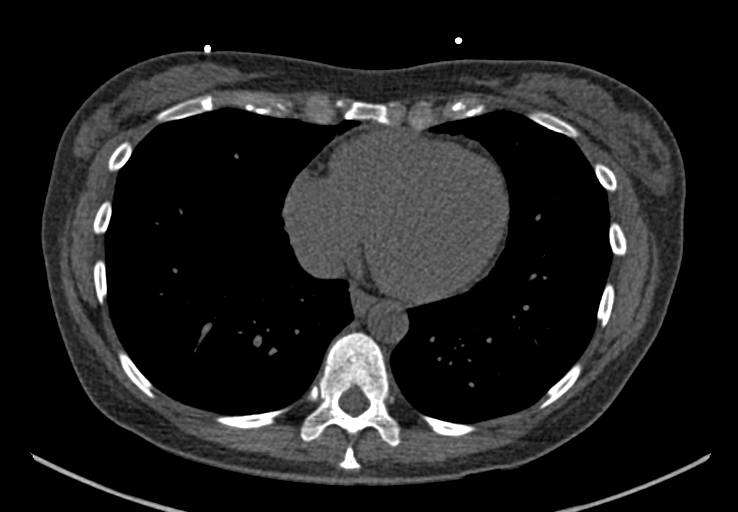
[im 40/80  vessel]
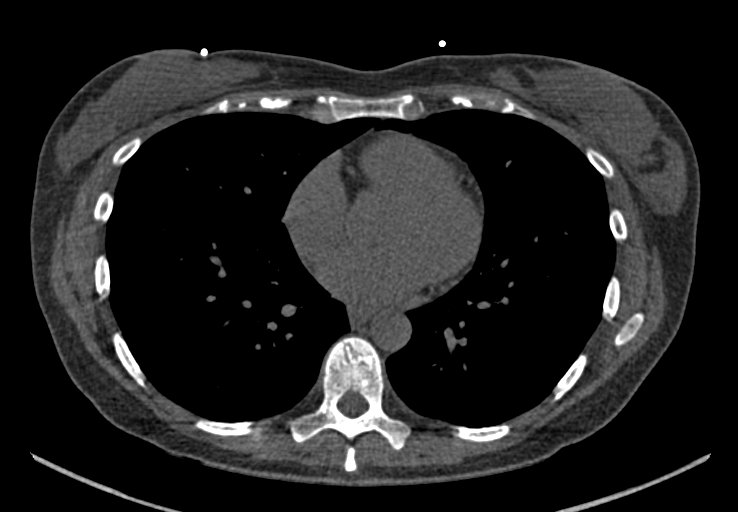
[im 53/80  vessel]
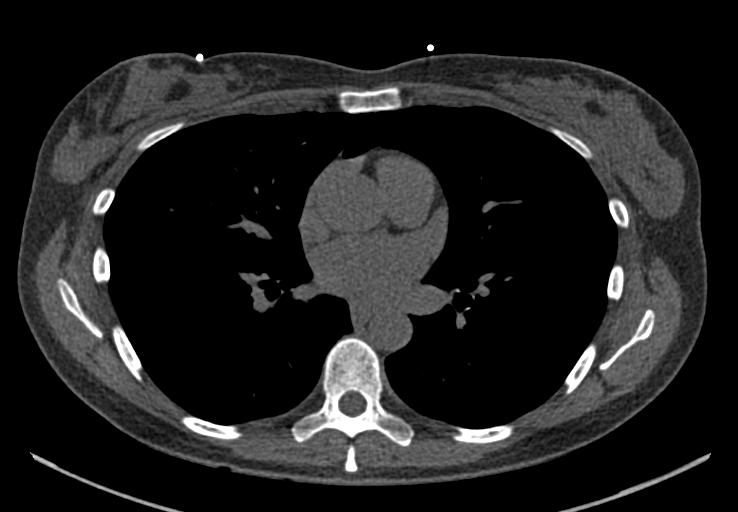
[im 66/80  vessel]
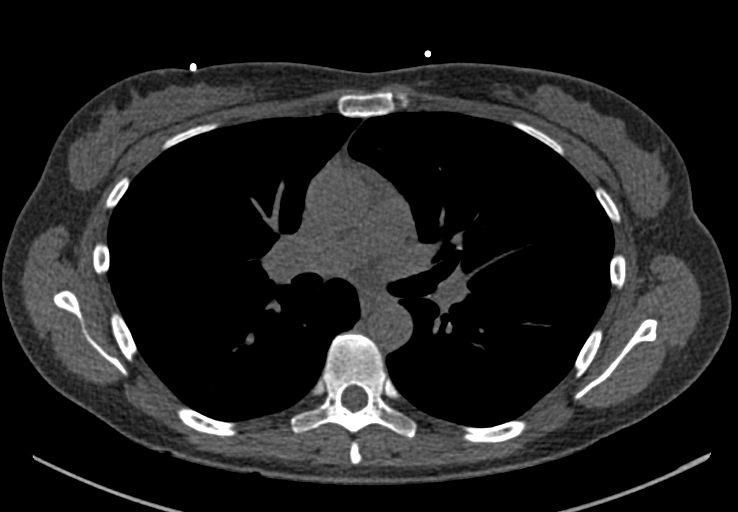
[im 66/80  lung]
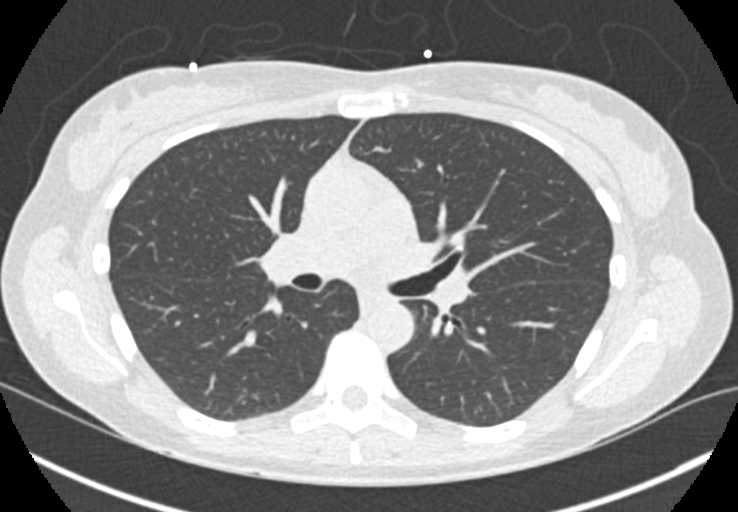

[Series 9: calcium scoring 2.00 br60 bestdiast 69% lungs · axial · 0.49mm/px · z∈[+1560,+1664]mm · 5 of 80 slices shown]
[im 14/80  vessel]
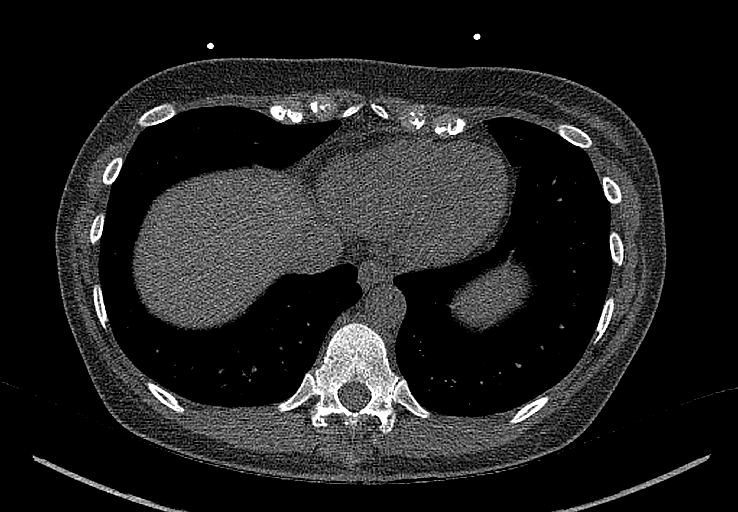
[im 27/80  vessel]
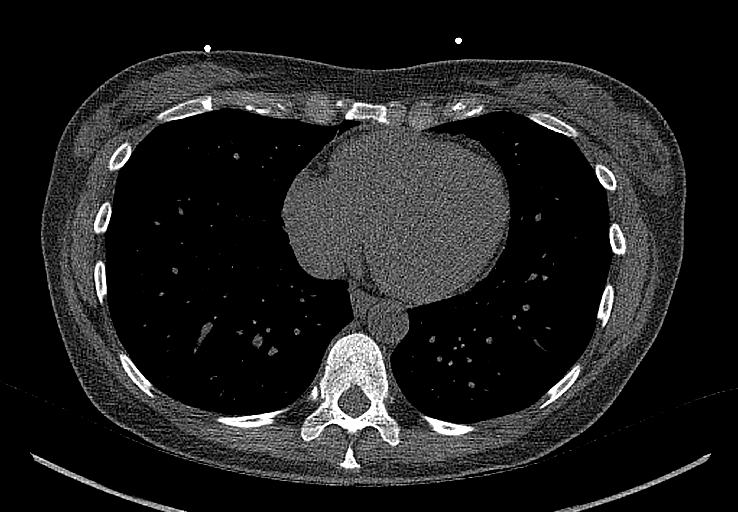
[im 40/80  vessel]
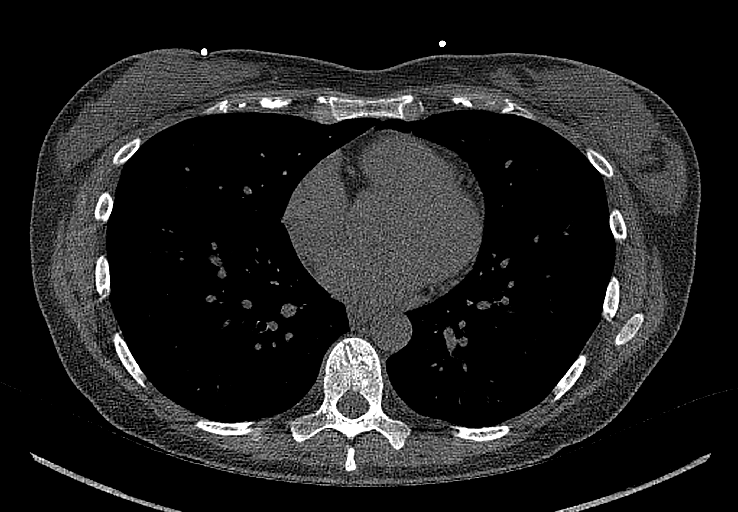
[im 53/80  vessel]
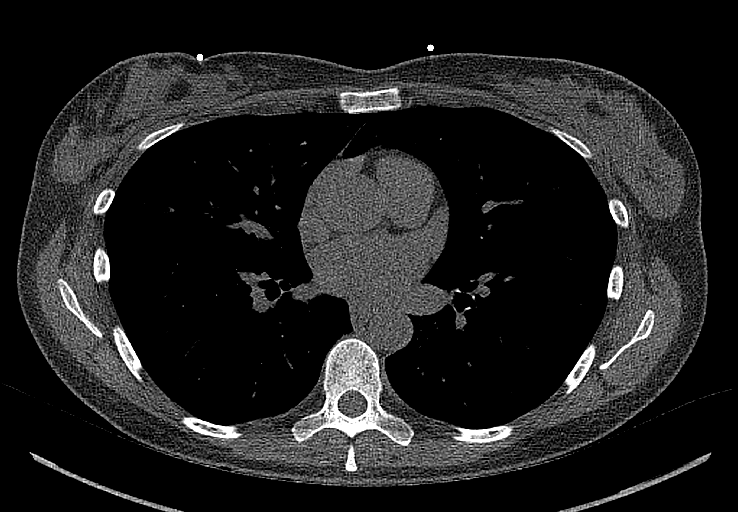
[im 66/80  vessel]
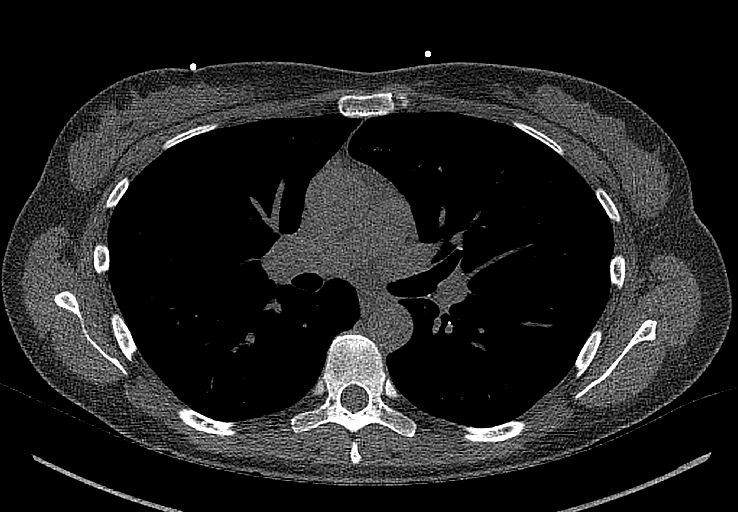

[12 of 20 positions shown; findings below may reference images not displayed]

FINDINGS: CORONARY CALCIUM SCORES:

Left Main: 0

LAD: 0

LCx: 0

RCA: 0

Total Agatston Score: 0

[HOSPITAL] percentile: 0

AORTA MEASUREMENTS:

Ascending Aorta: 31 mm

Descending Aorta: 20 mm

OTHER FINDINGS:

Heart size is normal without pericardial effusion. No visible
calcified atheromatous plaque. Imaging confined to the mid chest and
cardiac structures only. Central pulmonary vasculature as visualized
with normal caliber. Limited assessment of cardiovascular structures
given lack of intravenous contrast.

Mediastinum/Nodes: Visualized mediastinal structures are grossly
normal.

Lungs/Pleura: Visualized lungs are clear, visualized airways are
patent.

Upper Abdomen: Incidental imaging of upper abdominal contents
without acute process on very limited assessment.

Musculoskeletal: No acute musculoskeletal findings to the extent
evaluated. No destructive bone findings.
IMPRESSION: Coronary artery calcium score of 0. No signs of calcified coronary
artery disease.

## 2024-06-20 ENCOUNTER — Other Ambulatory Visit: Payer: Self-pay | Admitting: Obstetrics and Gynecology

## 2024-06-20 DIAGNOSIS — Z800 Family history of malignant neoplasm of digestive organs: Secondary | ICD-10-CM

## 2024-07-17 ENCOUNTER — Other Ambulatory Visit

## 2024-07-29 ENCOUNTER — Other Ambulatory Visit

## 2024-08-20 ENCOUNTER — Other Ambulatory Visit

## 2024-08-21 ENCOUNTER — Ambulatory Visit
Admission: RE | Admit: 2024-08-21 | Discharge: 2024-08-21 | Disposition: A | Source: Ambulatory Visit | Attending: Obstetrics and Gynecology | Admitting: Obstetrics and Gynecology

## 2024-08-21 DIAGNOSIS — Z800 Family history of malignant neoplasm of digestive organs: Secondary | ICD-10-CM

## 2024-08-21 MED ORDER — GADOPICLENOL 0.5 MMOL/ML IV SOLN
6.0000 mL | Freq: Once | INTRAVENOUS | Status: AC | PRN
  Administered 2024-08-21: 6 mL via INTRAVENOUS
